# Patient Record
Sex: Female | Born: 1935 | Race: White | Hispanic: No | Smoking: Former smoker
Health system: Southern US, Community
[De-identification: ages and names within clinical notes are randomized; demographics above are authoritative.]

## PROBLEM LIST (undated history)

## (undated) DIAGNOSIS — E079 Disorder of thyroid, unspecified: Secondary | ICD-10-CM

## (undated) HISTORY — PX: ABDOMINAL HYSTERECTOMY: SHX81

## (undated) HISTORY — PX: CHOLECYSTECTOMY: SHX55

## (undated) HISTORY — PX: ABDOMINAL SURGERY: SHX537

---

## 2017-02-18 ENCOUNTER — Emergency Department
Admission: EM | Admit: 2017-02-18 | Discharge: 2017-02-18 | Disposition: A | Discharge status: Home / Self Care | Payer: Medicare Other | Attending: Emergency Medicine | Admitting: Emergency Medicine

## 2017-02-18 ENCOUNTER — Other Ambulatory Visit: Payer: Self-pay

## 2017-02-18 ENCOUNTER — Encounter: Payer: Self-pay | Admitting: Emergency Medicine

## 2017-02-18 ENCOUNTER — Emergency Department: Payer: Medicare Other

## 2017-02-18 DIAGNOSIS — Z87891 Personal history of nicotine dependence: Secondary | ICD-10-CM | POA: Diagnosis not present

## 2017-02-18 DIAGNOSIS — Y92009 Unspecified place in unspecified non-institutional (private) residence as the place of occurrence of the external cause: Secondary | ICD-10-CM

## 2017-02-18 DIAGNOSIS — M25551 Pain in right hip: Secondary | ICD-10-CM | POA: Insufficient documentation

## 2017-02-18 DIAGNOSIS — W19XXXA Unspecified fall, initial encounter: Secondary | ICD-10-CM

## 2017-02-18 DIAGNOSIS — M5441 Lumbago with sciatica, right side: Secondary | ICD-10-CM | POA: Diagnosis not present

## 2017-02-18 HISTORY — DX: Disorder of thyroid, unspecified: E07.9

## 2017-02-18 MED ORDER — HYDROCODONE-ACETAMINOPHEN 5-325 MG PO TABS
1.0000 | ORAL_TABLET | Freq: Once | ORAL | Status: AC
  Administered 2017-02-18: 1 via ORAL
  Filled 2017-02-18: qty 1

## 2017-02-18 MED ORDER — DIAZEPAM 2 MG PO TABS: ORAL_TABLET | ORAL | 0 refills | Status: AC

## 2017-02-18 MED ORDER — HYDROCODONE-ACETAMINOPHEN 5-325 MG PO TABS: 1.0000 | ORAL_TABLET | Freq: Four times a day (QID) | ORAL | 0 refills | Status: AC | PRN

## 2017-02-18 NOTE — ED Triage Notes (Signed)
Pt c/o knot to right lower back. This area painful on palpation.  Pain with ambulation but is able to ambulate. Pain started 1 week ago after twisting and feeling a pop when mopping.  Pt fell last night on porch because of the pain in the right hip.  Also c/o knee pain on right; "it is like a burn on the inside of my knee".  Pt tearful in triage but from her husband passing yesterday not the pain.  Was given steroids initially and helped pain a little bit but took last pill yesterday and pain still there.  No xray was done of hip initially.

## 2017-02-18 NOTE — ED Provider Notes (Signed)
[facility] Emergency Department Provider Note  ____________________________________________   First MD Initiated Contact with Patient 02/18/17 1225     (approximate)  I have reviewed the triage vital signs and the nursing notes.   HISTORY  Chief Complaint Hip Pain   HPI Penny Mack is a 83 y.o. female is here with complaint of right lower back pain and hip pain that started approximately one week ago. Patient states she twisted and felt something pop at that time while she was mopping. She also states that she fell last evening on the porch because of the pain in her right hip. She also complains of right knee pain. She states that she took naproxen yesterday and today with little improvement. Patient states she is retaken steroids and took the last pill yesterday without any relief of her pain. Patient has continued to be ambulatory.  She denies any head injury or LOC.  she rates her pain as a 10 over 10.   Past Medical History:  Diagnosis Date  . Thyroid disease     There are no active problems to display for this patient.   Past Surgical History:  Procedure Laterality Date  . ABDOMINAL HYSTERECTOMY    . ABDOMINAL SURGERY    . CHOLECYSTECTOMY      Prior to Admission medications   Medication Sig Start Date End Date Taking? Authorizing Provider  diazepam (VALIUM) 2 MG tablet One tablet at hs 02/18/17   Tommi RumpsSummers, Sahand Gosch L, PA-C  HYDROcodone-acetaminophen (NORCO/VICODIN) 5-325 MG tablet Take 1 tablet by mouth every 6 (six) hours as needed for moderate pain. 02/18/17   Tommi RumpsSummers, Adelaida Reindel L, PA-C    Allergies Patient has no known allergies.  History reviewed. No pertinent family history.  Social History Social History   Tobacco Use  . Smoking status: Former Games developermoker  . Smokeless tobacco: Never Used  Substance Use Topics  . Alcohol use: No    Frequency: Never  . Drug use: No    Review of Systems Constitutional: No  fever/chills Cardiovascular: Denies chest pain. Respiratory: Denies shortness of breath. Gastrointestinal: No abdominal pain.  No nausea, no vomiting.   Genitourinary: Negative for dysuria. Musculoskeletal: positive low back pain. Positive right hip and knee pain. Skin: no injury. Neurological: Negative for headaches, focal weakness or numbness. ____________________________________________   PHYSICAL EXAM:  VITAL SIGNS: ED Triage Vitals  Enc Vitals Group     BP 02/18/17 1201 (!) 168/80     Pulse Rate 02/18/17 1201 77     Resp 02/18/17 1201 18     Temp 02/18/17 1201 98.5 F (36.9 C)     Temp Source 02/18/17 1201 Oral     SpO2 02/18/17 1201 96 %     Weight 02/18/17 1202 155 lb (70.3 kg)     Height 02/18/17 1202 5\' 3"  (1.6 m)     Head Circumference --      Peak Flow --      Pain Score 02/18/17 1205 10     Pain Loc --      Pain Edu? --      Excl. in GC? --    Constitutional: Alert and oriented. Well appearing and in no acute distress. Tearful. Eyes: Conjunctivae are normal.  Head: Atraumatic. Nose: No trauma Neck: No stridor.  No cervical tenderness on palpation posteriorly. Cardiovascular: Normal rate, regular rhythm. Grossly normal heart sounds.  Good peripheral circulation. Respiratory: Normal respiratory effort.  No retractions. Lungs CTAB. Gastrointestinal: Soft and nontender. No distention.  No CVA tenderness. Musculoskeletal: examination back there is no gross deformity or skin discolorations. There is some point tenderness on palpation of the lower lumbar spine.  There is marked tenderness on palpation of the right SI joint area and soft tissue surrounding this. Patient is able to abduct and adduct right hip but with increased pain. There is no gross deformity noted of the right knee. Degenerative changes are noted. No effusion. There is no shortening of the right limb or rotation. Neurologic:  Normal speech and language. No gross focal neurologic deficits are  appreciated.  Skin:  Skin is warm, dry and intact. No abrasions, ecchymosis or erythema noted. Psychiatric: Mood and affect are normal. Speech and behavior are normal.  ____________________________________________   LABS (all labs ordered are listed, but only abnormal results are displayed)  Labs Reviewed - No data to display  RADIOLOGY  Dg Pelvis 1-2 Views  Result Date: 02/18/2017 CLINICAL DATA:  Right hip pain.  Multiple falls. EXAM: PELVIS - 1-2 VIEW COMPARISON:  None. FINDINGS: No acute fracture.  No dislocation.  Osteopenia. IMPRESSION: No acute bony pathology. Electronically Signed   By: Jolaine ClickArthur  Hoss M.D.   On: 02/18/2017 13:21   Dg Femur Min 2 Views Right  Result Date: 02/18/2017 CLINICAL DATA:  Pain.  Several recent falls EXAM: RIGHT FEMUR 2 VIEWS COMPARISON:  None. FINDINGS: Frontal and lateral views were obtained. There is no fracture or dislocation. No abnormal periosteal reaction. There is mild narrowing of the right hip joint. There is mild narrowing of the medial and lateral compartments of the knee. No knee joint effusion evident. IMPRESSION: No fracture or dislocation. 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 No knee joint effusion. Electronically Signed   By: Bretta BangWilliam  Woodruff III M.D.   On: 02/18/2017 13:19    ____________________________________________   PROCEDURES  Procedure(s) performed: None  Procedures  Critical Care performed: No  ____________________________________________   INITIAL IMPRESSION / ASSESSMENT AND PLAN / ED COURSE Patient was given Norco while in the emergency department. Patient states that this eased her pain but did not completely "erase" her pain. We discussed pain medications with daughters present. Patient is aware that pain medication can increase drowsiness and increase her risk for falling. Daughter states that she does have a walker at home and that they will be staying with her. She was discharged with prescription for  Norco one every 6 hours as needed for moderate pain and diazepam 2 mg 1 at bedtime. She is encouraged to use ice or heat to her back as needed for discomfort. She is to follow-up with her PCP on Monday.  ____________________________________________   FINAL CLINICAL IMPRESSION(S) / ED DIAGNOSES  Final diagnoses:  Right hip pain  Acute right-sided low back pain with right-sided sciatica  Fall at home, initial encounter     ED Discharge Orders        Ordered    HYDROcodone-acetaminophen (NORCO/VICODIN) 5-325 MG tablet  Every 6 hours PRN     02/18/17 1358    diazepam (VALIUM) 2 MG tablet     02/18/17 1358       Note:  This document was prepared using Dragon voice recognition software and may include unintentional dictation errors.    Tommi RumpsSummers, Lido Maske L, PA-C 02/18/17 1515    Arnaldo NatalMalinda, Paul F, MD 02/25/17 2100

## 2017-02-18 NOTE — Discharge Instructions (Signed)
Follow up with her primary care provider if any continued problems. Norco one tablet every 6 hours if needed for pain. The aware that this medication could cause drowsiness as we have discussed. Diazepam 2 mg 1 at bedtime only.Use walker.  Moist heat or ice to her back and hip as needed for discomfort.

## 2018-09-16 ENCOUNTER — Encounter: Payer: Self-pay | Admitting: Emergency Medicine

## 2018-09-16 ENCOUNTER — Emergency Department
Admission: EM | Admit: 2018-09-16 | Discharge: 2018-09-16 | Disposition: A | Discharge status: Home / Self Care | Payer: Medicare Other | Attending: Emergency Medicine | Admitting: Emergency Medicine

## 2018-09-16 ENCOUNTER — Other Ambulatory Visit: Payer: Self-pay

## 2018-09-16 ENCOUNTER — Emergency Department: Payer: Medicare Other

## 2018-09-16 DIAGNOSIS — R42 Dizziness and giddiness: Secondary | ICD-10-CM | POA: Diagnosis not present

## 2018-09-16 DIAGNOSIS — Z87891 Personal history of nicotine dependence: Secondary | ICD-10-CM | POA: Insufficient documentation

## 2018-09-16 LAB — BASIC METABOLIC PANEL
Anion gap: 9 (ref 5–15)
BUN: 17 mg/dL (ref 8–23)
CO2: 27 mmol/L (ref 22–32)
Calcium: 9.5 mg/dL (ref 8.9–10.3)
Chloride: 105 mmol/L (ref 98–111)
Creatinine, Ser: 0.61 mg/dL (ref 0.44–1.00)
GFR calc Af Amer: >60 mL/min (ref >60)
GFR calc non Af Amer: >60 mL/min (ref >60)
Glucose, Bld: 94 mg/dL (ref 70–99)
Potassium: 3.8 mmol/L (ref 3.5–5.1)
Sodium: 141 mmol/L (ref 135–145)

## 2018-09-16 LAB — URINALYSIS, COMPLETE (UACMP) WITH MICROSCOPIC
Bacteria, UA: NONE SEEN
Bilirubin Urine: NEGATIVE
Glucose, UA: NEGATIVE mg/dL
Hgb urine dipstick: NEGATIVE
Ketones, ur: NEGATIVE mg/dL
Leukocytes,Ua: NEGATIVE
Nitrite: NEGATIVE
Protein, ur: NEGATIVE mg/dL
Specific Gravity, Urine: 1.013 (ref 1.005–1.030)
Squamous Epithelial / LPF: NONE SEEN (ref 0–5)
pH: 6 (ref 5.0–8.0)

## 2018-09-16 LAB — CBC
HCT: 41.2 % (ref 36.0–46.0)
Hemoglobin: 13.8 g/dL (ref 12.0–15.0)
MCH: 30.7 pg (ref 26.0–34.0)
MCHC: 33.5 g/dL (ref 30.0–36.0)
MCV: 91.8 fL (ref 80.0–100.0)
Platelets: 185 10*3/uL (ref 150–400)
RBC: 4.49 MIL/uL (ref 3.87–5.11)
RDW: 13.1 % (ref 11.5–15.5)
WBC: 5.9 10*3/uL (ref 4.0–10.5)
nRBC: 0 % (ref 0.0–0.2)

## 2018-09-16 MED ORDER — MECLIZINE HCL 12.5 MG PO TABS: 12.5000 mg | ORAL_TABLET | Freq: Three times a day (TID) | ORAL | 0 refills | Status: AC | PRN

## 2018-09-16 MED ORDER — MECLIZINE HCL 25 MG PO TABS
12.5000 mg | ORAL_TABLET | Freq: Once | ORAL | Status: AC
  Administered 2018-09-16: 12.5 mg via ORAL
  Filled 2018-09-16: qty 1

## 2018-09-16 MED ORDER — SODIUM CHLORIDE 0.9% FLUSH: 3.0000 mL | Freq: Once | INTRAVENOUS | Status: DC

## 2018-09-16 NOTE — ED Provider Notes (Signed)
[facility] Emergency Department Provider Note   ____________________________________________   First MD Initiated Contact with Patient 09/16/18 1754     (approximate)  I have reviewed the triage vital signs and the nursing notes.   HISTORY  Chief Complaint Dizziness    HPI Penny Mack is a 85 y.o. female here for evaluation of feeling "dizzy"  Patient reports for a couple days now she has been feeling like a spinning feeling.  It comes and goes but seems to be worse when she gets up.  She has had to hold onto things to maintain balance.  She has had a slight feeling of strangeness on her scalp for a day.  She checked her blood pressure today and noticed it was elevated.  No trouble speaking.  No weakness in the arms or legs.  Reports she just feels "dizzy" and it feels like a "the room spinning" type feeling  No chest pain or trouble breathing.  No history of stroke.  Currently reports her only medication she takes is Synthroid  Patient reports she thinks she might have "vertigo" as her sister had similar in the past.   Past Medical History:  Diagnosis Date   Thyroid disease     There are no active problems to display for this patient.   Past Surgical History:  Procedure Laterality Date   ABDOMINAL HYSTERECTOMY     ABDOMINAL SURGERY     CHOLECYSTECTOMY      Prior to Admission medications   Medication Sig Start Date End Date Taking? Authorizing Provider  diazepam (VALIUM) 2 MG tablet One tablet at hs 02/18/17   Tommi RumpsSummers, Rhonda L, PA-C  HYDROcodone-acetaminophen (NORCO/VICODIN) 5-325 MG tablet Take 1 tablet by mouth every 6 (six) hours as needed for moderate pain. 02/18/17   Tommi RumpsSummers, Rhonda L, PA-C    Allergies Patient has no known allergies.  History reviewed. No pertinent family history.  Social History Social History   Tobacco Use   Smoking status: Former Smoker   Smokeless tobacco: Never Used  Substance Use Topics    Alcohol use: No    Frequency: Never   Drug use: No    Review of Systems Constitutional: No fever/chills and no exposure to coronavirus. Eyes: No visual changes.  No blurring of vision or loss of vision. ENT: No sore throat. Cardiovascular: Denies chest pain. Respiratory: Denies shortness of breath. Gastrointestinal: No abdominal pain.   Musculoskeletal: Negative for back pain. Skin: Negative for rash. Neurological: Negative for headaches, areas of focal weakness or numbness. Endocrine: On Synthroid   ____________________________________________   PHYSICAL EXAM:  VITAL SIGNS: ED Triage Vitals [09/16/18 1550]  Enc Vitals Group     BP (!) 191/72     Pulse Rate (!) 57     Resp 18     Temp 98 F (36.7 C)     Temp Source Oral     SpO2 99 %     Weight 155 lb (70.3 kg)     Height 5\' 3"  (1.6 m)     Head Circumference      Peak Flow      Pain Score 0     Pain Loc      Pain Edu?      Excl. in GC?     Constitutional: Alert and oriented. Well appearing and in no acute distress.  She is quite pleasant. Eyes: Conjunctivae are normal. Head: Atraumatic.  No noted ataxia. Nose: No congestion/rhinnorhea. Mouth/Throat: Mucous membranes are moist. Neck: No  stridor.  Cardiovascular: Normal rate, regular rhythm. Grossly normal heart sounds.  Good peripheral circulation. Respiratory: Normal respiratory effort.  No retractions. Lungs CTAB. Gastrointestinal: Soft and nontender. No distention. Musculoskeletal: No lower extremity tenderness nor edema.  No pronator drift in any extremity.  Normal strength and sensation in the arms legs and face. Neurologic:  Normal speech and language. No gross focal neurologic deficits are appreciated.  Equal and symmetric smile. Skin:  Skin is warm, dry and intact. No rash noted. Psychiatric: Mood and affect are normal. Speech and behavior are normal.  ____________________________________________   LABS (all labs ordered are listed, but only  abnormal results are displayed)  Labs Reviewed  URINALYSIS, COMPLETE (UACMP) WITH MICROSCOPIC - Abnormal; Notable for the following components:      Result Value   Color, Urine YELLOW (*)    APPearance CLEAR (*)    All other components within normal limits  BASIC METABOLIC PANEL  CBC  CBG MONITORING, ED   ____________________________________________  EKG  Reviewed and interpreted by me at 1835 Heart rate 65 QRS 90 QTc 470 Normal sinus rhythm, no evidence of acute ischemia or ectopy ____________________________________________  RADIOLOGY  Read did not crossover in PACS, read by radiologist reports normal MRI of the brain  MRI of the brain ordered to rule out central process in particular acute stroke ____________________________________________   PROCEDURES  Procedure(s) performed: None  Procedures  Critical Care performed: No  ____________________________________________   INITIAL IMPRESSION / ASSESSMENT AND PLAN / ED COURSE  Pertinent labs & imaging results that were available during my care of the patient were reviewed by me and considered in my medical decision making (see chart for details).   Dizziness, describes a vertigo-like symptom.  It is associated with some hypertension and she reports this is new for her.  She denies symptoms of chest pain blurring of vision musculoskeletal weakness or paresthesia.  Reassuring neurologic exam, but given difficulty in detecting subtle strokes with clinical assessment and her age and new hypertension I have ordered an MRI to rule out stroke.  I feel most likely this represents peripheral vertigo, potentially could also represent a hypertensive urgency, but I do wish to exclude stroke as well.  Patient comfortable with plan to obtain MRI, trial Antivert.  We will continue to monitor her blood pressures here she has no history of hypertension and actually reports to me that her blood pressure normally "runs low"      ----------------------------------------- 10:55 PM on 09/16/2018 -----------------------------------------  Resting comfortably.  Patient in no distress, able to get up and ambulate with out assistance and she feels better with Antivert.  MRI negative for stroke.  Blood pressures improved, now 829 systolic.  Blood pressure is trended down, I do not think that this represents hypertensive urgency or hypertension symptoms, discussed with the patient she will follow-up with her primary care doctor regarding hypertension and monitor blood pressure at home.  Careful return precautions discussed, her daughter will be picking her up.  Additionally, she does not have any symptoms to suggest COVID.  And she is agreeable to following up with ear nose and throat.  Prescription sent to pharmacy of patient's choice  Return precautions and treatment recommendations and follow-up discussed with the patient who is agreeable with the plan.   ____________________________________________   FINAL CLINICAL IMPRESSION(S) / ED DIAGNOSES  Final diagnoses:  Vertigo  Dizziness        Note:  This document was prepared using Dragon voice recognition software and may  include unintentional dictation errors       Sharyn CreamerQuale, Katheryne Gorr, MD 09/16/18 2257

## 2018-09-16 NOTE — ED Notes (Signed)
Daughter of pt notified that pt is awaiting MRI.

## 2018-09-16 NOTE — ED Notes (Signed)
Pt up to ambulate around the room to see if pt experienced weakness or dizziness, pt reports "I feel fine" RN Sam notified

## 2018-09-16 NOTE — ED Triage Notes (Signed)
Pt to ED with c/o of dizziness that has being coming/going for several days.

## 2018-09-16 NOTE — Discharge Instructions (Signed)
We believe your symptoms were caused by benign vertigo.  Please read through the included information and take any prescribed medication(s).  Follow up with your doctor as listed above.  If you develop any new or worsening symptoms that concern you, including but not limited to persistent dizziness/vertigo, numbness or weakness in your arms or legs, altered mental status, persistent vomiting, or fever greater than 101, please return immediately to the Emergency Department.  Return to the Emergency Department (ED) if you experience any chest pain/pressure/tightness, difficulty breathing, or sudden sweating, or other symptoms that concern you.

## 2020-07-09 IMAGING — MR MRI HEAD WITHOUT CONTRAST
9 of 10 series · 42 of 48 positions shown · non-contrast
Comparison: None available.

CLINICAL DATA: Initial evaluation for acute dizziness, vertigo.

EXAM:
MRI HEAD WITHOUT CONTRAST
TECHNIQUE: Multiplanar, multiecho pulse sequences of the brain and surrounding
structures were obtained without intravenous contrast.

[Series 2: ax dwi_tracew · axial · 3.0mm · 0.83mm/px · z∈[-71,+87]mm · 6 of 55 slices shown]
[im 1/55]
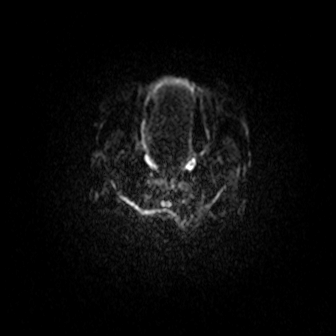
[im 11/55]
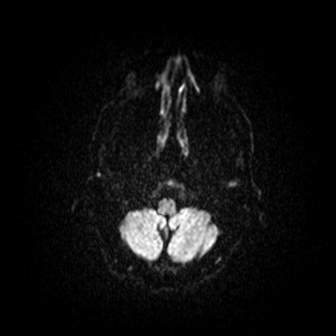
[im 22/55]
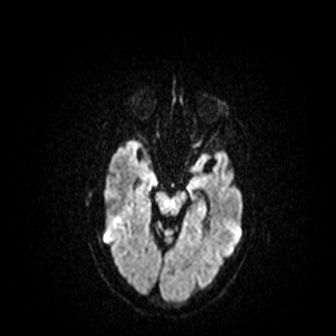
[im 33/55]
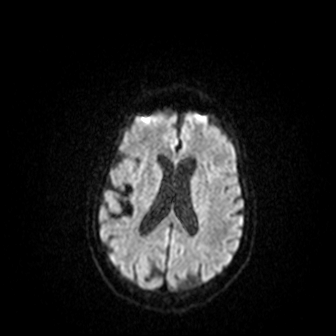
[im 44/55]
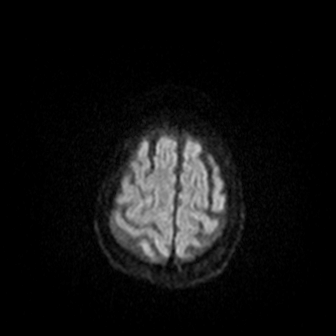
[im 55/55]
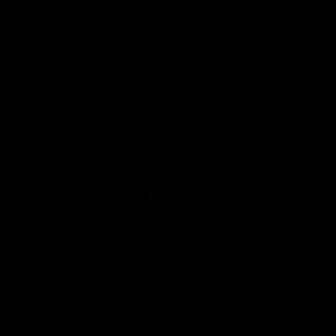

[Series 3: ax dwi_adc · axial · 3.0mm · 0.83mm/px · z∈[-71,+84]mm · 7 of 54 slices shown]
[im 1/54]
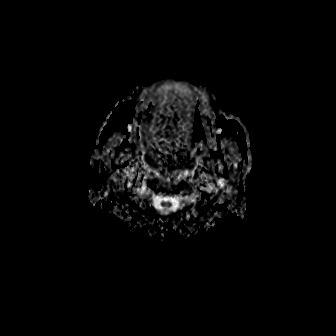
[im 9/54]
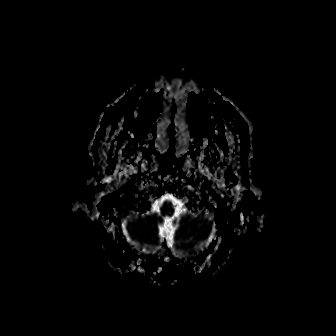
[im 18/54]
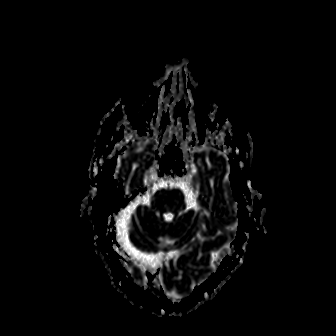
[im 27/54]
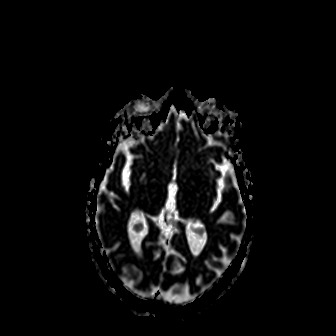
[im 36/54]
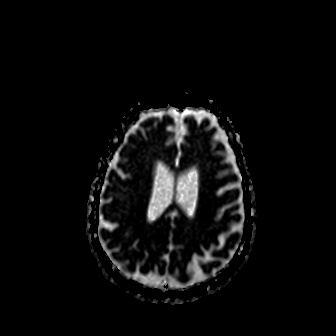
[im 45/54]
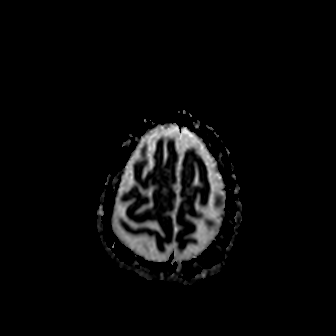
[im 54/54]
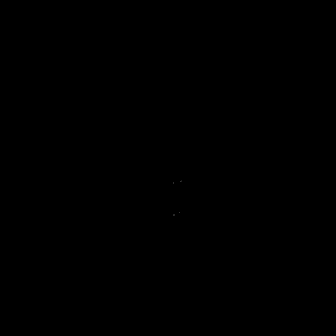

[Series 4: cor dwi_tracew · coronal · 5.0mm · 0.68mm/px · 6 of 45 slices shown]
[im 1/45]
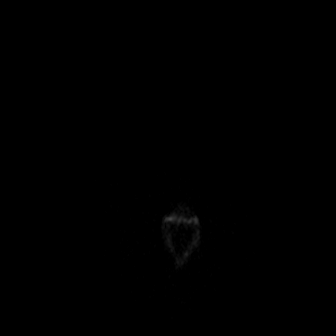
[im 9/45]
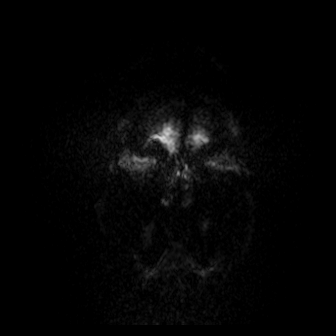
[im 18/45]
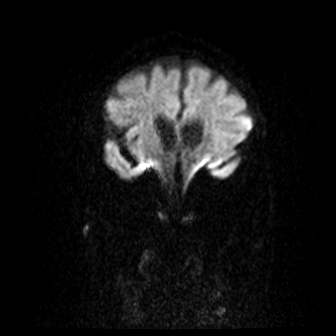
[im 27/45]
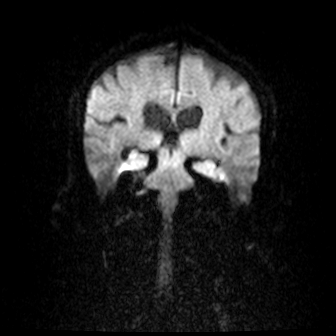
[im 36/45]
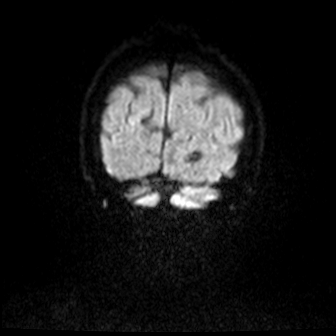
[im 45/45]
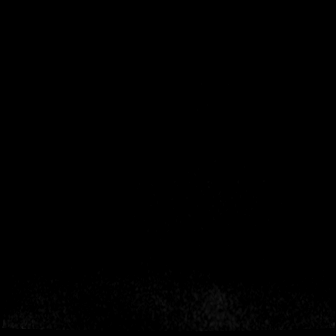

[Series 5: cor dwi_adc · coronal · 5.0mm · 0.68mm/px · 4 of 44 slices shown]
[im 1/44]
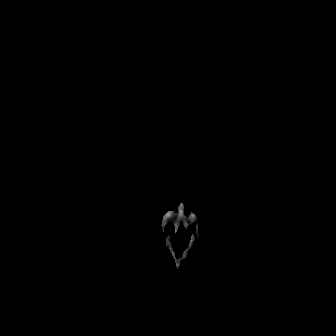
[im 9/44]
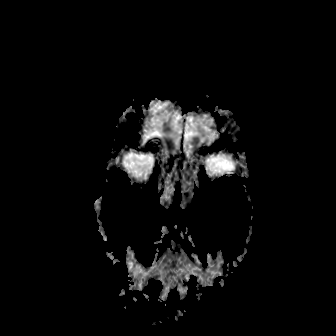
[im 18/44]
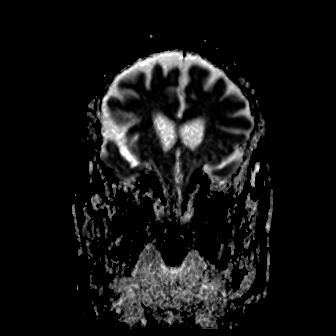
[im 26/44]
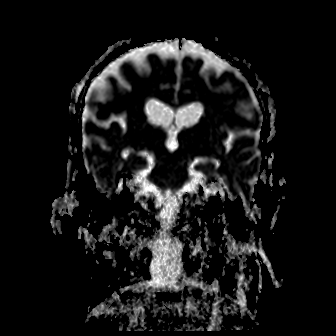

[Series 6: T1 · sagittal · 5.0mm · 0.94mm/px · 3 of 25 slices shown (1 of 2)]
[im 1/25]
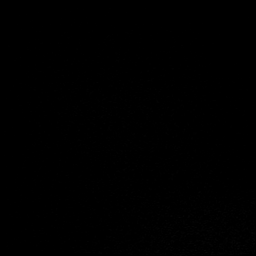
[im 13/25]
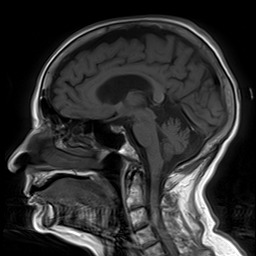
[im 25/25]
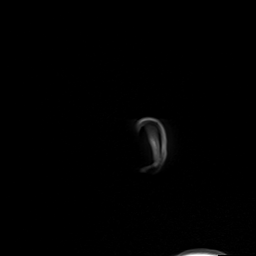

[Series 7: T2 · axial · 5.0mm · 0.45mm/px · z∈[-62,+91]mm · 4 of 27 slices shown (1 of 2)]
[im 1/27]
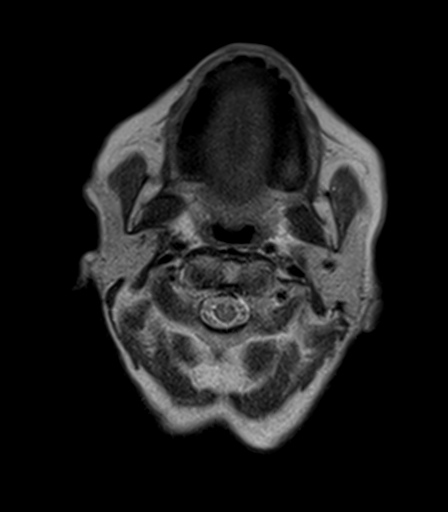
[im 9/27]
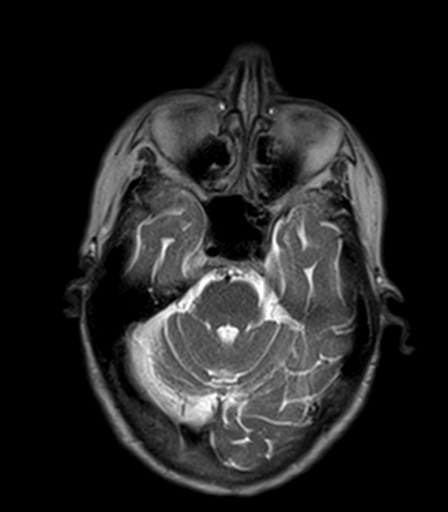
[im 18/27]
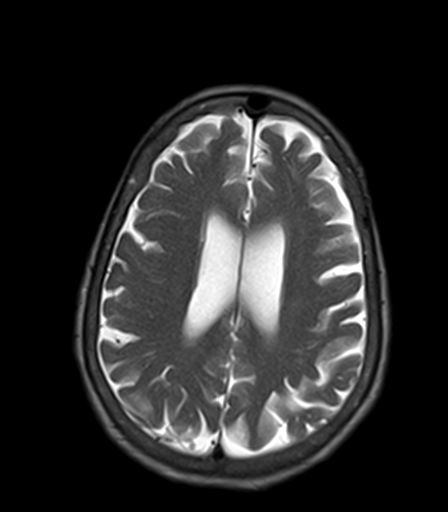
[im 27/27]
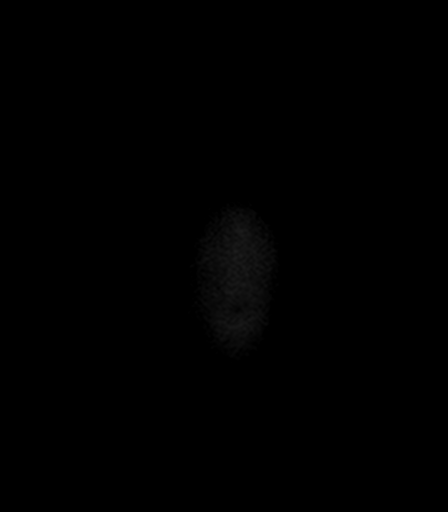

[Series 9: FLAIR · axial · 5.0mm · 1.20mm/px · z∈[-62,+91]mm · 4 of 27 slices shown]
[im 1/27]
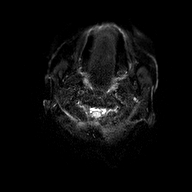
[im 9/27]
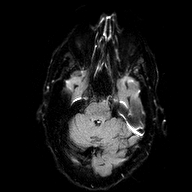
[im 18/27]
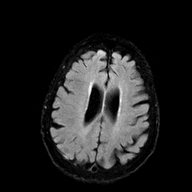
[im 27/27]
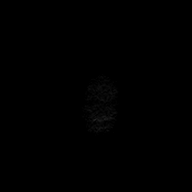

[Series 10: T1 · axial · 5.0mm · 0.90mm/px · z∈[-62,+91]mm · 4 of 27 slices shown (2 of 2)]
[im 1/27]
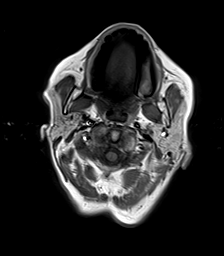
[im 9/27]
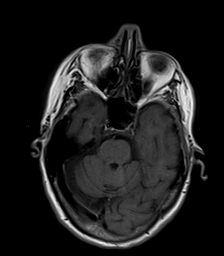
[im 18/27]
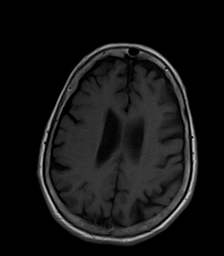
[im 27/27]
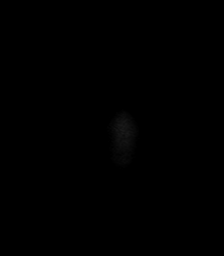

[Series 11: T2 · coronal · 5.0mm · 0.45mm/px · 4 of 31 slices shown (2 of 2)]
[im 1/31]
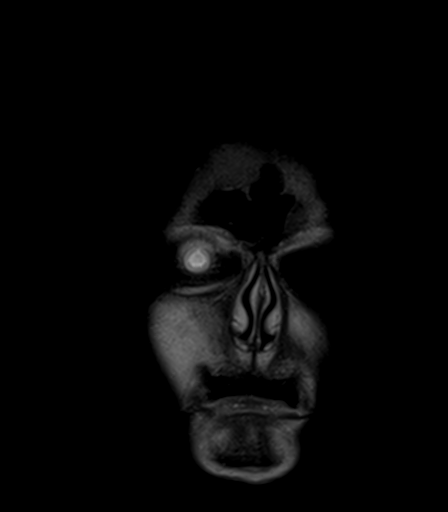
[im 11/31]
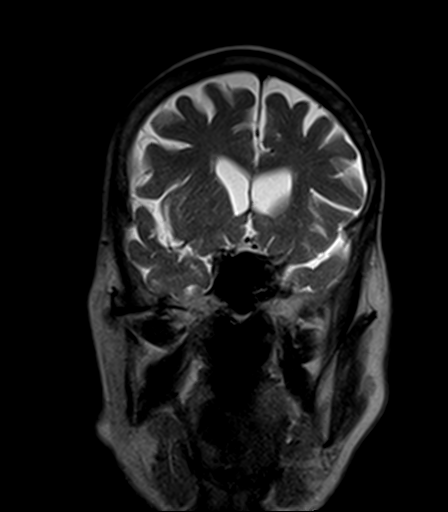
[im 21/31]
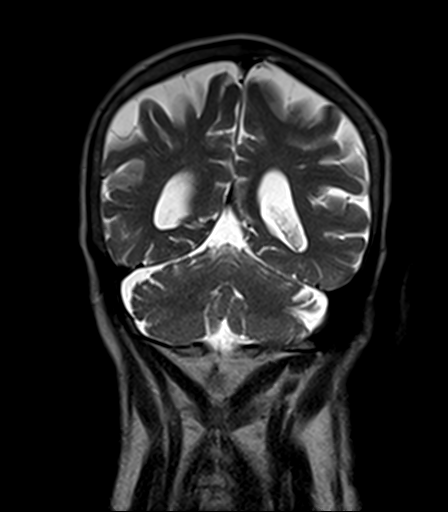
[im 31/31]
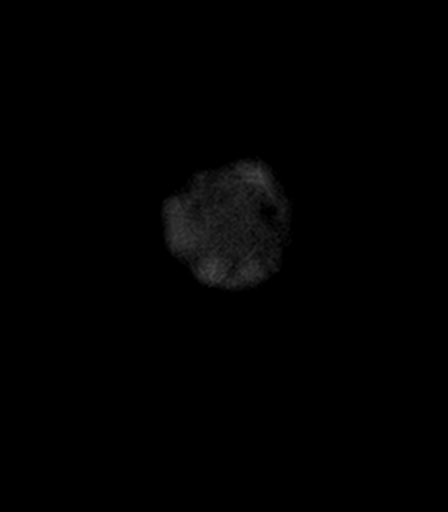

[42 of 48 positions shown; findings below may reference images not displayed]

FINDINGS: Brain: Generalized age-related cerebral atrophy. Minimal T2/FLAIR
hyperintensity within the periventricular white matter, most like
related chronic microvascular ischemic disease, felt to be within
normal limits for age. No abnormal foci of restricted diffusion to
suggest acute or subacute ischemia. Gray-white matter
differentiation maintained. No encephalomalacia to suggest chronic
cortical infarction. No foci of susceptibility artifact to suggest
acute or chronic intracranial hemorrhage.

No mass lesion, midline shift or mass effect. No hydrocephalus. No
extra-axial fluid collection. Pituitary gland suprasellar region
normal. Midline structures intact.

Vascular: Major intracranial vascular flow voids are maintained.

Skull and upper cervical spine: Craniocervical junction normal.
Visualized upper cervical spine demonstrates no significant finding.
Bone marrow signal intensity within normal limits. No scalp soft
tissue abnormality.

Sinuses/Orbits: Patient status post bilateral ocular lens
replacement. Globes and orbital soft tissues demonstrate no acute
finding. Paranasal sinuses are clear. No mastoid effusion. Inner ear
structures normal.

Other: None.
IMPRESSION: Normal brain MRI for patient age. No acute intracranial infarct or
other abnormality identified.
